# Patient Record
Sex: Male | Born: 1970 | Race: White | Hispanic: No | Marital: Single
Health system: Southern US, Community
[De-identification: ages and names within clinical notes are randomized; demographics above are authoritative.]

## PROBLEM LIST (undated history)

## (undated) DIAGNOSIS — I10 Essential (primary) hypertension: Secondary | ICD-10-CM

## (undated) DIAGNOSIS — F1011 Alcohol abuse, in remission: Secondary | ICD-10-CM

## (undated) DIAGNOSIS — I2699 Other pulmonary embolism without acute cor pulmonale: Secondary | ICD-10-CM

---

## 2020-10-26 ENCOUNTER — Other Ambulatory Visit: Payer: Self-pay

## 2020-10-26 ENCOUNTER — Emergency Department (HOSPITAL_COMMUNITY)
Admission: EM | Admit: 2020-10-26 | Discharge: 2020-10-27 | Disposition: A | Discharge status: Home / Self Care | Payer: Self-pay | Attending: Emergency Medicine | Admitting: Emergency Medicine

## 2020-10-26 ENCOUNTER — Encounter (HOSPITAL_COMMUNITY): Payer: Self-pay | Admitting: Emergency Medicine

## 2020-10-26 ENCOUNTER — Emergency Department (HOSPITAL_COMMUNITY): Payer: Self-pay

## 2020-10-26 DIAGNOSIS — I10 Essential (primary) hypertension: Secondary | ICD-10-CM | POA: Insufficient documentation

## 2020-10-26 DIAGNOSIS — R202 Paresthesia of skin: Secondary | ICD-10-CM | POA: Insufficient documentation

## 2020-10-26 DIAGNOSIS — Z20822 Contact with and (suspected) exposure to covid-19: Secondary | ICD-10-CM | POA: Insufficient documentation

## 2020-10-26 DIAGNOSIS — M5416 Radiculopathy, lumbar region: Secondary | ICD-10-CM

## 2020-10-26 DIAGNOSIS — R531 Weakness: Secondary | ICD-10-CM | POA: Insufficient documentation

## 2020-10-26 HISTORY — DX: Essential (primary) hypertension: I10

## 2020-10-26 HISTORY — DX: Alcohol abuse, in remission: F10.11

## 2020-10-26 HISTORY — DX: Other pulmonary embolism without acute cor pulmonale: I26.99

## 2020-10-26 MED ORDER — MORPHINE SULFATE (PF) 4 MG/ML IV SOLN
4.0000 mg | Freq: Once | INTRAVENOUS | Status: AC
Start: 2020-10-26 — End: 2020-10-26
  Administered 2020-10-26: 4 mg via INTRAVENOUS
  Filled 2020-10-26: qty 1

## 2020-10-26 MED ORDER — HYDROCODONE-ACETAMINOPHEN 5-325 MG PO TABS
1.0000 | ORAL_TABLET | Freq: Once | ORAL | Status: AC
  Administered 2020-10-26: 1 via ORAL
  Filled 2020-10-26: qty 1

## 2020-10-26 NOTE — ED Provider Notes (Addendum)
[facility]-EMERGENCY DEPT Provider Note   CSN: [id number] Arrival date & time: 10/26/20  1638     History Chief Complaint  Patient presents with  . Numbness    Gabriel Le is a 50 y.o. male.  HPI    50 year old male comes in a chief complaint of numbness He has history of hypertension, cord compression status post decompression at [facility] last year.  Patient reports that since his surgery he has had chronic right lower extremity numbness and slight weakness.  Over the last 3 days he has noted increased weakness and he has burning type pain radiating from his back onto his knee.  He also reports new numbness in his left foot.  Patient is also now started noticing tingling sensation in his testicle and numbness by his anus, all symptoms consistent with prior cord compression, prompting him to come to the ER.  Review of system is also positive for some urinary discomfort.  Patient feels like he has to push hard to get his urine out.  He denies any urinary incontinence.  Patient also feels like he has less control over his anal sphincter.  Past Medical History:  Diagnosis Date  . History of alcohol abuse   . Hypertension   . Pulmonary emboli (HCC)     There are no problems to display for this patient.   History reviewed. No pertinent surgical history.     No family history on file.     Home Medications Prior to Admission medications   Medication Sig Start Date End Date Taking? Authorizing Provider  amLODipine (NORVASC) 5 MG tablet Take 5 mg by mouth daily. 06/14/20  Yes [provider]  omeprazole (PRILOSEC) 20 MG capsule Take 20 mg by mouth daily. 06/10/20 06/10/21 Yes [provider]  PROVENTIL HFA 108 (90 Base) MCG/ACT inhaler Inhale 2 puffs into the lungs every 6 (six) hours as needed for wheezing. 06/14/20  Yes [provider]  XARELTO 20 MG TABS tablet Take 20 mg by mouth every morning. 06/14/20  Yes [provider]    Allergies    Patient has no known allergies.  Review of Systems   Review of Systems  Constitutional: Positive for activity change.  Genitourinary: Positive for difficulty urinating.  Neurological: Positive for weakness and numbness.  All other systems reviewed and are negative.   Physical Exam Updated Vital Signs BP 124/90   Pulse 80   Temp 97.9 F (36.6 C) (Oral)   Resp 18   Ht 6\' 1"  (1.854 m)   Wt 101.6 kg   SpO2 93%   BMI 29.55 kg/m   Physical Exam Vitals and nursing note reviewed.  Constitutional:      Appearance: He is well-developed.  HENT:     Head: Atraumatic.  Cardiovascular:     Rate and Rhythm: Normal rate.  Pulmonary:     Effort: Pulmonary effort is normal.  Musculoskeletal:     Cervical back: Neck supple.  Skin:    General: Skin is warm.  Neurological:     Mental Status: He is alert and oriented to person, place, and time.     Comments: 1+ bilateral patellar reflex Patient has reduced sensation over the right lower extremity and left foot. Strength is 4+ out of 5 for left upper extremity and 4 out of 5 right lower extremity      ED Results / Procedures / Treatments   Labs (all labs ordered are listed, but only abnormal results are displayed)  Labs Reviewed  RESP PANEL BY RT-PCR (FLU A&B, COVID) ARPGX2  CBC WITH DIFFERENTIAL/PLATELET  BASIC METABOLIC PANEL  C-REACTIVE PROTEIN  SEDIMENTATION RATE    EKG None  Radiology MR LUMBAR SPINE WO CONTRAST  Result Date: 10/26/2020 CLINICAL DATA:  Lateral right leg pain and numbness. Difficulty emptying bladder EXAM: MRI LUMBAR SPINE WITHOUT CONTRAST TECHNIQUE: Multiplanar, multisequence MR imaging of the lumbar spine was performed. No intravenous contrast was administered. COMPARISON:  None. FINDINGS: Segmentation:  Standard. Alignment:  Lumbar levocurvature.  Slight retrolisthesis L3 on L4. Vertebrae: No fracture, evidence of discitis, or bone lesion. Discogenic endplate marrow changes at  L3-4. Conus medullaris and cauda equina: Conus extends to the L1-2 level. Conus and cauda equina appear normal. Paraspinal and other soft tissues: Postsurgical changes from L1-2 through L5-S1. No fluid collections. Single mildly enlarged left para-aortic lymph node measuring 10 mm short axis (series 9, image 29). Disc levels: T12-L1: Broad-based disc bulge and ligamentum flavum buckling result in mild canal stenosis without foraminal stenosis. L1-L2: Prior posterior decompression. Moderate sized left paracentral disc herniation contributes to moderate canal stenosis and severe left subarticular recess stenosis. Mild bilateral facet hypertrophy. Bilateral foramina are patent. L2-L3: Prior posterior decompression. Broad-based disc bulge and bilateral facet hypertrophy contribute to mild-to-moderate canal stenosis with right worse than left subarticular recess stenosis and mild bilateral foraminal stenosis. L3-L4: Prior posterior decompression. Broad-based disc bulge with central disc protrusion. Bilateral facet hypertrophy. Findings contribute to mild-to-moderate canal stenosis with advanced bilateral subarticular recess stenosis. Moderate to severe right and mild left foraminal stenosis. L4-L5: Prior posterior decompression. Broad base disc bulge and bilateral facet hypertrophy contribute to mild canal stenosis with severe bilateral subarticular recess stenosis. Moderate to severe left and mild-to-moderate right foraminal stenosis. L5-S1: Prior posterior decompression. Broad-based disc bulge and osteophytic endplate spurring result in moderate to severe left foraminal stenosis and mild bilateral subarticular recess stenosis. No canal stenosis. IMPRESSION: 1. Postsurgical changes from prior posterior decompression at the L1-2 through L5-S1 levels. 2. Moderate sized left paracentral disc herniation at L1-2 contributes to moderate canal stenosis and severe left subarticular recess stenosis at this level. 3.  Mild-to-moderate canal stenosis at L2-3 and L3-4 with advanced bilateral subarticular recess stenosis. 4. Multilevel bilateral foraminal stenosis, moderate-to-severe on the right at L3-4 and on the left at L4-5 and L5-S1. 5. Single mildly enlarged left para-aortic lymph node measuring 10 mm short axis. This is nonspecific and could be reactive. Electronically Signed   By: Duanne Guess D.O.   On: 10/26/2020 19:55    Procedures .Critical Care Performed by: Derwood Kaplan, MD Authorized by: Derwood Kaplan, MD   Critical care provider statement:    Critical care time (minutes):  56   Critical care was necessary to treat or prevent imminent or life-threatening deterioration of the following conditions:  CNS failure or compromise   Critical care was time spent personally by me on the following activities:  Discussions with consultants, evaluation of patient's response to treatment, examination of patient, ordering and performing treatments and interventions, ordering and review of laboratory studies, ordering and review of radiographic studies, pulse oximetry, re-evaluation of patient's condition, obtaining history from patient or surrogate and review of old charts     Medications Ordered in ED Medications  morphine 4 MG/ML injection 4 mg (4 mg Intravenous Given 10/26/20 1902)  HYDROcodone-acetaminophen (NORCO/VICODIN) 5-325 MG per tablet 1 tablet (1 tablet Oral Given 10/26/20 2329)    ED Course  I have reviewed the triage vital signs and the nursing  notes.  Pertinent labs & imaging results that were available during my care of the patient were reviewed by me and considered in my medical decision making (see chart for details).  Clinical Course as of 10/27/20 0037  Sat Oct 26, 2020  2303 MR LUMBAR SPINE WO CONTRAST [AN]  2303 Patient's MRI of the lumbar spine is not showing any acute compression.  Unfortunately, we will have to get MRI T-spine added to the work-up.  We had ordered MRI  T-spine in time to be completed at [facility], however it appears that MRI team was never notified.   We will transfer the patient to [facility] for MRI T-spine.  If the MRI is negative, then he will benefit with neuro consult to see if there is any concerns for demyelinating process. [AN]    Clinical Course User Index [AN] Derwood Kaplan, MD   MDM Rules/Calculators/A&P                          50 year old male with history of cord compression in the past requiring surgical decompression comes in a chief complaint of back pain with worsening right-sided weakness, new evidence of numbness in the left foot, saddle anesthesia and urinary discomfort.   Clinical concerns for cauda equina versus cord compression again.  We will order stat MRI without contrast. Postvoid residual is about 200 cc.   12:37 AM I reassessed the patient after the MRI L-spine was negative.  He is reporting that his symptoms are similar to his prior cauda equina/cord compression that required imminent decompression.  I have added MRI T-spine to his work-up.  Unfortunately, the on-call MRI team was not informed in time about the additional MRI, therefore patient will be transferred to [facility].  If the MRI is negative, then patient will need reassessment by the EDP.  Neurology consultation will be needed to ensure patient does not have transverse myelitis or demyelinating process.   12:37 AM Neurosurgery APP and I reviewed the presentation and the MRI findings.  He will reviewed the case with the neurosurgery attending and call us back with recommendations.  I have requested that they see the patient given the constellation of symptoms he is having -even if it is later in the morning. We havent heard back from NSurgery - Dr. Bebe Shaggy at [facility] made aware of the encounter as he will likely be the staff working by the time we received a call back.  12:37 AM Neurosurgery team has reviewed the image.  Dr. Venetia Maxon has looked at the  images and determined that patient does not have critical compression/emergent pathology explaining his symptoms.  They recommend that if the MRI T-spine is negative then patient be seen by his own neurosurgeon.  They were aware of the cauda equina-like symptoms that patient is having. Final Clinical Impression(s) / ED Diagnoses Final diagnoses:  Paresthesia    Rx / DC Orders ED Discharge Orders    None          Derwood Kaplan, MD 10/26/20 0354    Derwood Kaplan, MD 10/27/20 Tammi Sou, MD 10/27/20 [id number]

## 2020-10-26 NOTE — ED Triage Notes (Signed)
Patient reports tingling to lateral right leg with pain and numbness lower legs and groin since waking this morning. Reports difficulty emptying bladder. States same symptoms occurred prior to back surgery x1 year ago. Surgeries through [facility], recently moved to [place]. Denies recent injury.

## 2020-10-26 NOTE — ED Notes (Signed)
Patient picked up by [facility] to take to .

## 2020-10-26 NOTE — ED Notes (Signed)
Patient transported to MRI 

## 2020-10-26 NOTE — ED Notes (Signed)
Report called to [facility] ED RN, Shanda Bumps. She is aware of patients arrival. [facility] report given to [facility].

## 2020-10-26 NOTE — ED Notes (Signed)
Bladder scan shows volume of >186.

## 2020-10-27 ENCOUNTER — Emergency Department (HOSPITAL_COMMUNITY): Payer: Self-pay

## 2020-10-27 LAB — CBC WITH DIFFERENTIAL/PLATELET
Abs Immature Granulocytes: 0.05 10*3/uL (ref 0.00–0.07)
Basophils Absolute: 0.1 10*3/uL (ref 0.0–0.1)
Basophils Relative: 1 %
Eosinophils Absolute: 0.1 10*3/uL (ref 0.0–0.5)
Eosinophils Relative: 1 %
HCT: 47 % (ref 39.0–52.0)
Hemoglobin: 15.5 g/dL (ref 13.0–17.0)
Immature Granulocytes: 1 %
Lymphocytes Relative: 32 %
Lymphs Abs: 2.9 10*3/uL (ref 0.7–4.0)
MCH: 29.6 pg (ref 26.0–34.0)
MCHC: 33 g/dL (ref 30.0–36.0)
MCV: 89.7 fL (ref 80.0–100.0)
Monocytes Absolute: 0.6 10*3/uL (ref 0.1–1.0)
Monocytes Relative: 7 %
Neutro Abs: 5.3 10*3/uL (ref 1.7–7.7)
Neutrophils Relative %: 58 %
Platelets: 197 10*3/uL (ref 150–400)
RBC: 5.24 MIL/uL (ref 4.22–5.81)
RDW: 15.4 % (ref 11.5–15.5)
WBC: 9.1 10*3/uL (ref 4.0–10.5)
nRBC: 0 % (ref 0.0–0.2)

## 2020-10-27 LAB — BASIC METABOLIC PANEL
Anion gap: 11 (ref 5–15)
BUN: 12 mg/dL (ref 6–20)
CO2: 22 mmol/L (ref 22–32)
Calcium: 9 mg/dL (ref 8.9–10.3)
Chloride: 104 mmol/L (ref 98–111)
Creatinine, Ser: 0.76 mg/dL (ref 0.61–1.24)
GFR, Estimated: >60 mL/min (ref >60)
Glucose, Bld: 99 mg/dL (ref 70–99)
Potassium: 3.8 mmol/L (ref 3.5–5.1)
Sodium: 137 mmol/L (ref 135–145)

## 2020-10-27 LAB — C-REACTIVE PROTEIN: CRP: 0.5 mg/dL (ref <1.0)

## 2020-10-27 LAB — RESP PANEL BY RT-PCR (FLU A&B, COVID) ARPGX2
Influenza A by PCR: NEGATIVE
Influenza B by PCR: NEGATIVE
SARS Coronavirus 2 by RT PCR: NEGATIVE

## 2020-10-27 LAB — SEDIMENTATION RATE: Sed Rate: 12 mm/hr (ref 0–16)

## 2020-10-27 MED ORDER — METHOCARBAMOL 500 MG PO TABS: 500.0000 mg | ORAL_TABLET | Freq: Two times a day (BID) | ORAL | 0 refills | Status: AC

## 2020-10-27 MED ORDER — MORPHINE SULFATE (PF) 4 MG/ML IV SOLN
4.0000 mg | Freq: Once | INTRAVENOUS | Status: AC
Start: 2020-10-27 — End: 2020-10-27
  Administered 2020-10-27: 4 mg via INTRAVENOUS
  Filled 2020-10-27: qty 1

## 2020-10-27 MED ORDER — GADOBUTROL 1 MMOL/ML IV SOLN
10.0000 mL | Freq: Once | INTRAVENOUS | Status: AC | PRN
  Administered 2020-10-27: 10 mL via INTRAVENOUS

## 2020-10-27 NOTE — ED Provider Notes (Signed)
I assumed care of this patient.  Please see previous provider note for further details of Hx, PE.  Briefly patient is a 49 y.o. male who presented for right leg numbness. Negative limbar MRI. Sent for T MRI.  Thoracic MRI with mild to moderate stenosis. Not likely contributing to his presentation as he has not signs of upper motor neuron defict (ie clonus or hyperreflexia). Patient does have lumbar muscle strain/spasm. Will Rx Robaxin. 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   [facility] follow up recommended.  The patient appears reasonably screened and/or stabilized for discharge and I doubt any other medical condition or other [facility] requiring further screening, evaluation, or treatment in the ED at this time prior to discharge. Safe for discharge with strict return precautions.  Disposition: Discharge  Condition: Good  I have discussed the results, Dx and Tx plan with the patient/family who expressed understanding and agree(s) with the plan. Discharge instructions discussed at length. The patient/family was given strict return precautions who verbalized understanding of the instructions. No further questions at time of discharge.    ED Discharge Orders         Ordered    methocarbamol (ROBAXIN) 500 MG tablet  2 times daily        10/27/20 1517            Follow Up: Neurosurgery  Call  to schedule an appointment for close follow up  Maeola Harman, MD [address] [place] [address] [phone]  Call  in case you are not able to get in with your neurosurgeon        Eudelia Bunch Amadeo Garnet, MD 10/27/20 [phone]

## 2020-10-27 NOTE — ED Notes (Signed)
Pt comes from [facility] for MRI to rule out spinal compression

## 2020-10-27 NOTE — Discharge Instructions (Signed)
You may use over-the-counter Motrin (Ibuprofen), Acetaminophen (Tylenol), topical muscle creams such as SalonPas, Icy Hot, Bengay, etc. Please stretch, apply ice or heat (whichever helps), and have massage therapy for additional assistance.  

## 2021-10-26 IMAGING — MR MR THORACIC SPINE WO/W CM
4 of 8 series · 19 of 48 positions shown · IV contrast (10 GADAVIST)
Comparison: None.

CLINICAL DATA: Progressive myelopathy. Chronic lower extremity
numbness. Status post spinal decompression.

EXAM:
MRI THORACIC WITHOUT AND WITH CONTRAST
TECHNIQUE: Multiplanar and multiecho pulse sequences of the thoracic spine were
obtained without and with intravenous contrast.
CONTRAST:  10mL GADAVIST GADOBUTROL 1 MMOL/ML IV SOLN

[Series 9: T2 · axial · 4.0mm · 0.39mm/px · z∈[-435,-80]mm · 9 of 66 slices shown (1 of 2)]
[im 1/66]
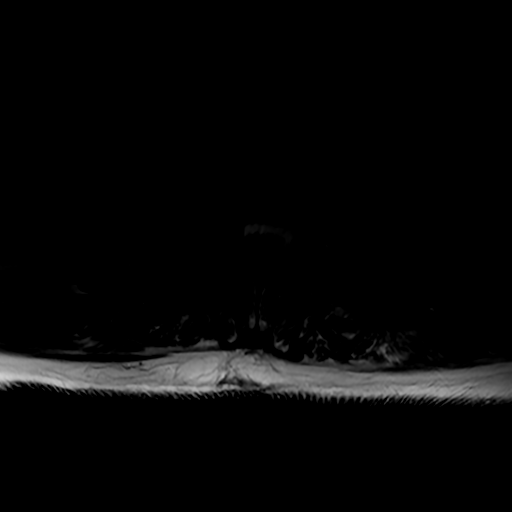
[im 9/66]
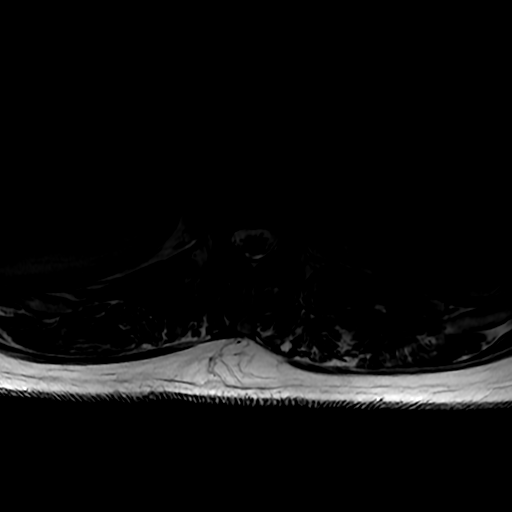
[im 17/66]
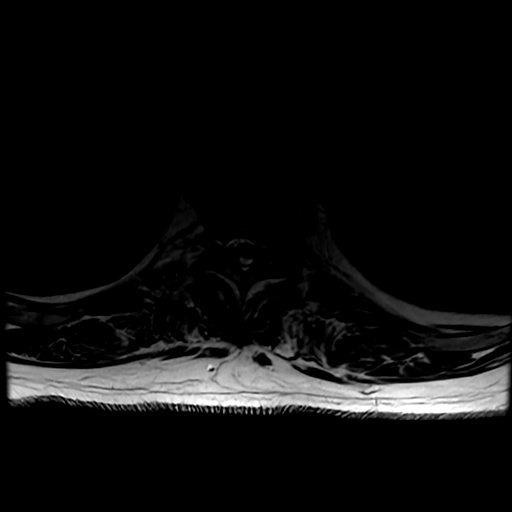
[im 25/66]
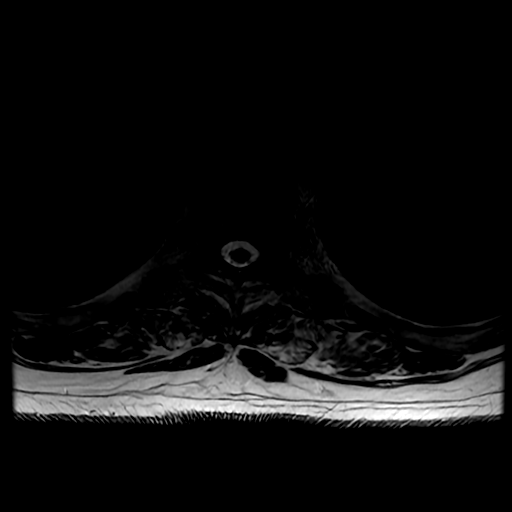
[im 33/66]
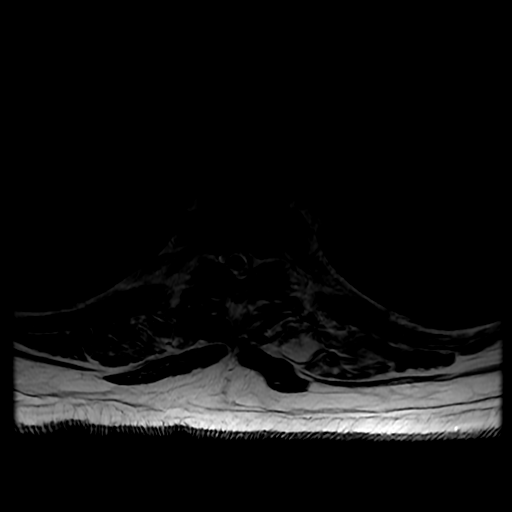
[im 41/66]
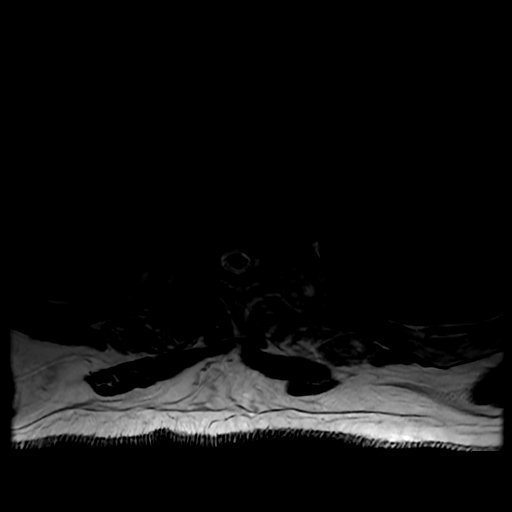
[im 49/66]
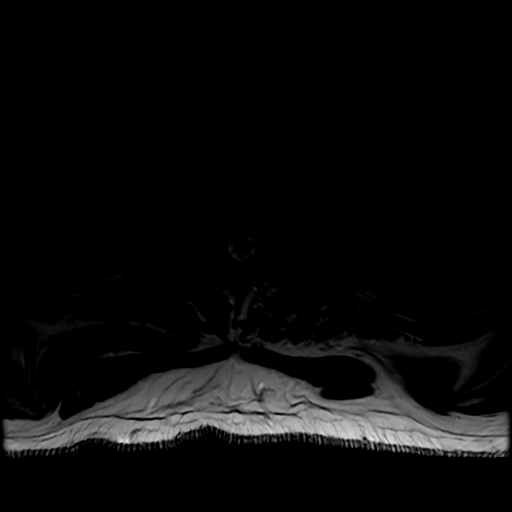
[im 57/66]
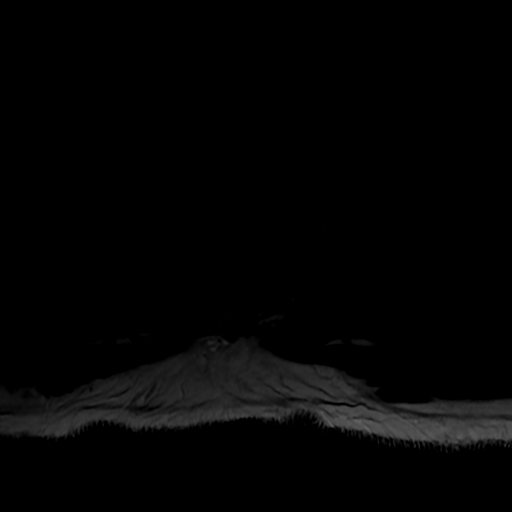
[im 66/66]
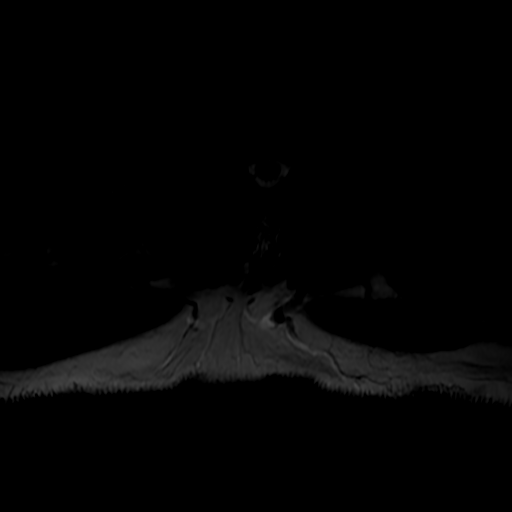

[Series 12: T1 · axial · non-contrast · 4.0mm · 0.39mm/px · z∈[-435,-129]mm · 4 of 66 slices shown (1 of 2)]
[im 1/66]
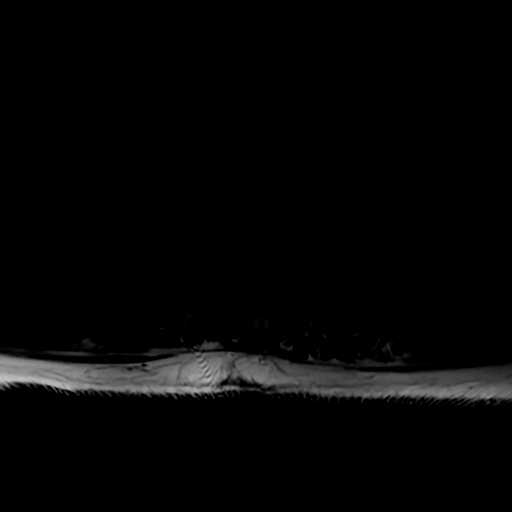
[im 9/66]
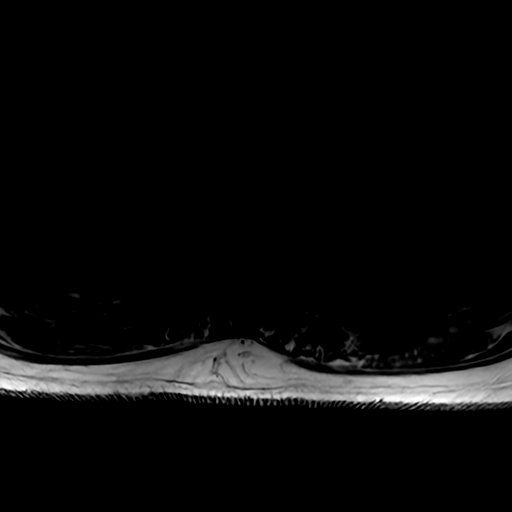
[im 33/66]
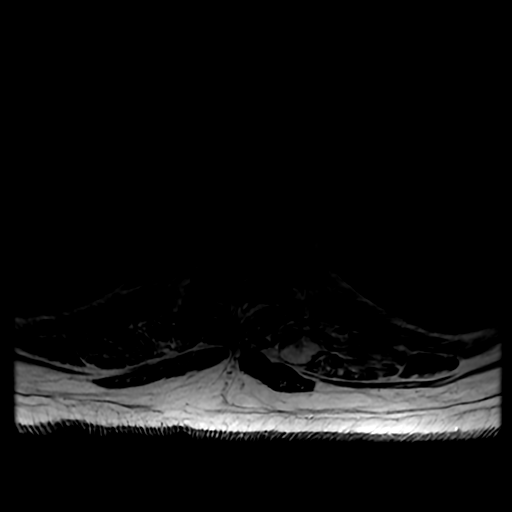
[im 57/66]
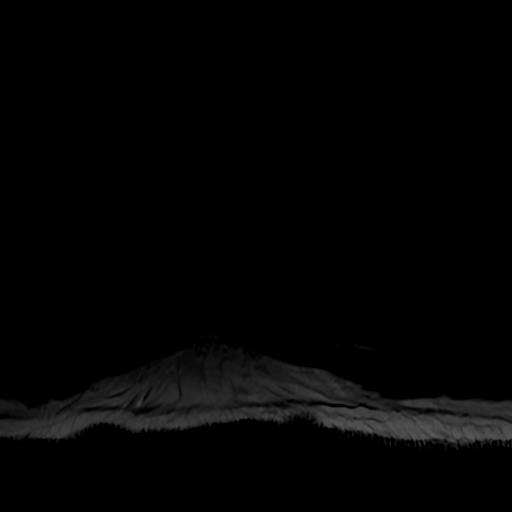

[Series 13: T1 · sagittal · 3.0mm · 0.74mm/px · 3 of 19 slices shown (2 of 2)]
[im 1/19]
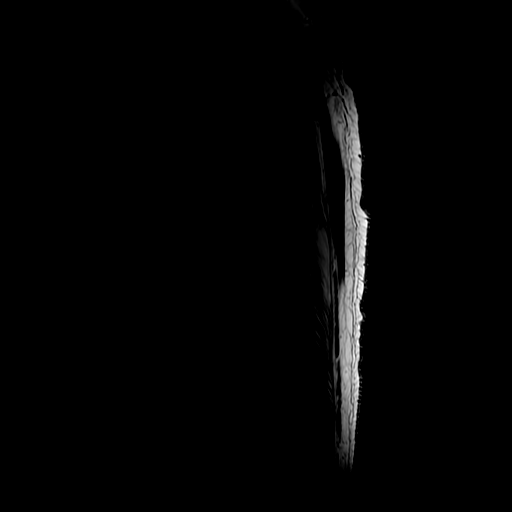
[im 10/19]
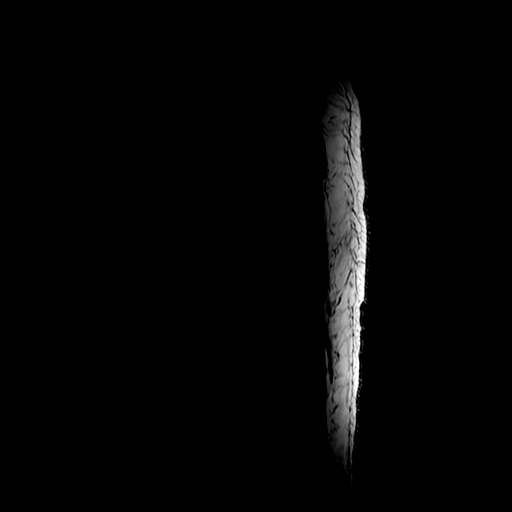
[im 19/19]
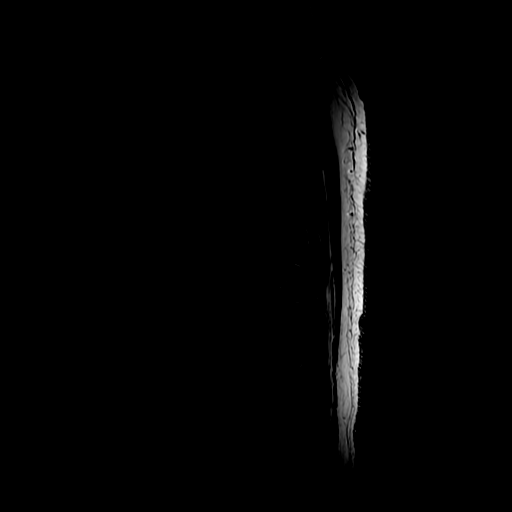

[Series 14: T2 · sagittal · 3.0mm · 0.74mm/px · 3 of 19 slices shown (2 of 2)]
[im 1/19]
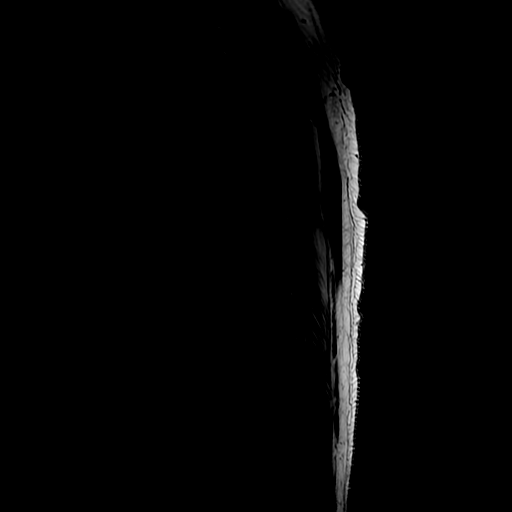
[im 10/19]
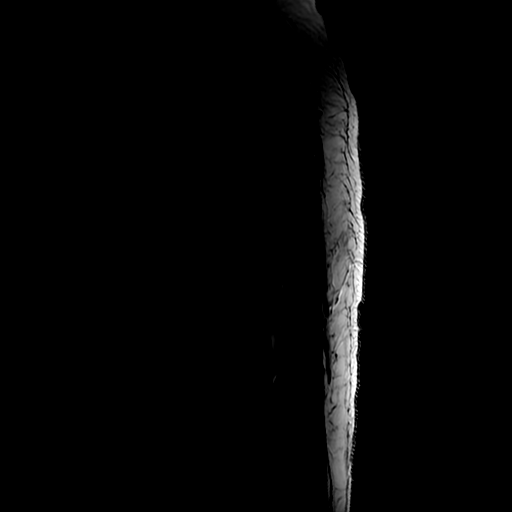
[im 19/19]
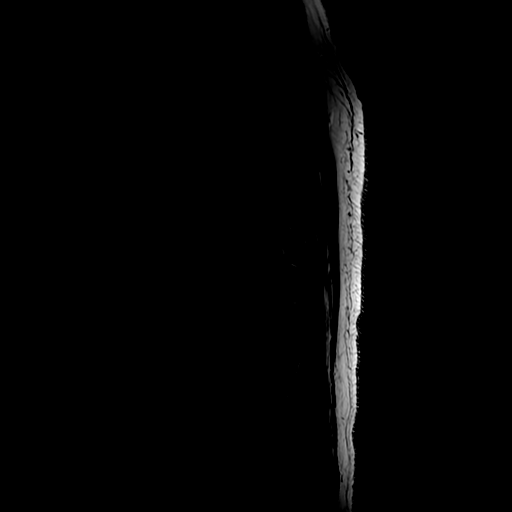

[19 of 48 positions shown; findings below may reference images not displayed]

FINDINGS: Alignment:  Physiologic.

Vertebrae: No fracture, evidence of discitis, or bone lesion.

Cord:  Normal signal and morphology.

Paraspinal and other soft tissues: Negative. No abnormal contrast
enhancement.

Disc levels:

T3-4: Small disc bulge with mild spinal canal narrowing.

T4-5: Small left subarticular disc protrusion with mild spinal canal
stenosis.

T5-6: Small left subarticular disc protrusion indenting the left
ventral spinal cord. No spinal canal stenosis.

T6-7: Small disc bulge with mild spinal canal stenosis.

T7-8: Intermediate sized left subarticular disc protrusion with
moderate spinal canal stenosis.

T10-11: Intermediate disc bulge with moderate spinal canal stenosis.
Moderate bilateral foraminal stenosis.

The other disc levels are unremarkable.
IMPRESSION: 1. No acute abnormality of the thoracic spine.
2. Multilevel mild-to-moderate spinal canal stenosis, worst at T7-8
and T10-11.
3. Moderate bilateral foraminal stenosis at T10-11.
